# Patient Record
Sex: Male | Born: 1995 | Hispanic: Refuse to answer | Marital: Single | Smoking: Never smoker
Health system: Southern US, Community
[De-identification: ages and names within clinical notes are randomized; demographics above are authoritative.]

## PROBLEM LIST (undated history)

## (undated) DIAGNOSIS — R011 Cardiac murmur, unspecified: Secondary | ICD-10-CM

## (undated) HISTORY — DX: Cardiac murmur, unspecified: R01.1

---
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
## 2016-04-08 ENCOUNTER — Encounter: Payer: Self-pay | Admitting: Family Medicine

## 2016-04-08 ENCOUNTER — Ambulatory Visit (INDEPENDENT_AMBULATORY_CARE_PROVIDER_SITE_OTHER): Payer: 59 | Admitting: Family Medicine

## 2016-04-08 DIAGNOSIS — E7800 Pure hypercholesterolemia, unspecified: Secondary | ICD-10-CM

## 2016-04-08 NOTE — Progress Notes (Signed)
Patient not being seen today because he is not fasting. It is coming back tomorrow for fasting lipid panel.

## 2016-04-09 ENCOUNTER — Ambulatory Visit (INDEPENDENT_AMBULATORY_CARE_PROVIDER_SITE_OTHER): Payer: 59 | Admitting: Family Medicine

## 2016-04-09 ENCOUNTER — Ambulatory Visit: Payer: 59 | Admitting: Family Medicine

## 2016-04-09 VITALS — BP 129/71 | HR 79 | Temp 97.5°F | Resp 14

## 2016-04-09 DIAGNOSIS — Z8342 Family history of familial hypercholesterolemia: Secondary | ICD-10-CM

## 2016-04-09 MED ORDER — FLUTICASONE PROPIONATE 50 MCG/ACT NA SUSP: 2.0000 | Freq: Every day | NASAL | 6 refills | Status: DC

## 2016-04-09 MED ORDER — AMOXICILLIN 500 MG PO CAPS: 500.0000 mg | ORAL_CAPSULE | Freq: Two times a day (BID) | ORAL | 0 refills | Status: DC

## 2016-04-09 NOTE — Progress Notes (Signed)
Patient presents today for fasting lipid panel. Patient has a family history of high cholesterol with his father. He states that his father has hypertension, obesity, diabetes. Father with MI hx in his 30s. Patient has never been told that he has high cholesterol. He denies any other problems. Patient does not take any supplements.  ROS: Negative except mentioned above.  Vitals as per Epic.  GENERAL: NAD HEENT- mild pharyngeal erythema, no exudate, no erythema of TMs, no cervical LAD, mild maxillary tenderness L>R RESP: CTA B CARD: RRR NEURO: CN II-XII grossly intact   A/P: 1)Family history of high cholesterol - Will check fasting lipid panel today. Will inform patient of results next week when reviewed.  2)Sinusitis-will treat with Flonase, Claritin, Sudafed when necessary, Ibuprofen when necessary, amoxicillin, seek medical attention if symptoms persist or worsen.

## 2016-04-09 NOTE — Addendum Note (Signed)
Addended by: Dione HousekeeperPATEL, Cebastian Neis N on: 04/09/2016 12:53 PM   Modules accepted: Orders

## 2016-04-10 LAB — LIPID PANEL
CHOL/HDL RATIO: 2.2 ratio (ref 0.0–5.0)
Cholesterol, Total: 122 mg/dL (ref 100–169)
HDL: 56 mg/dL (ref >39)
LDL CALC: 53 mg/dL (ref 0–109)
TRIGLYCERIDES: 65 mg/dL (ref 0–89)
VLDL CHOLESTEROL CAL: 13 mg/dL (ref 5–40)

## 2016-04-22 ENCOUNTER — Other Ambulatory Visit: Payer: Self-pay | Admitting: Family Medicine

## 2016-04-22 MED ORDER — AZITHROMYCIN 250 MG PO TABS: ORAL_TABLET | ORAL | 0 refills | Status: DC

## 2016-08-20 ENCOUNTER — Ambulatory Visit (INDEPENDENT_AMBULATORY_CARE_PROVIDER_SITE_OTHER): Payer: 59 | Admitting: Family Medicine

## 2016-08-20 ENCOUNTER — Encounter: Payer: Self-pay | Admitting: Family Medicine

## 2016-08-20 VITALS — BP 120/76 | HR 78 | Temp 98.1°F | Resp 16

## 2016-08-20 DIAGNOSIS — J111 Influenza due to unidentified influenza virus with other respiratory manifestations: Secondary | ICD-10-CM

## 2016-08-20 NOTE — Progress Notes (Signed)
Patient presents with symptoms of nasal drainage, cough, myalgias, subjective fever, night sweats, chills since yesterday. Denies CP, SOB, NVD, severe headache. Took Ibuprofen this morning.   ROS: Negative except mentioned above. Vitals as per EPIC.  GENERAL: NAD HEENT: mild pharyngeal erythema, no exudate, no erythema of TMs, no cervical LAD RESP: CTA B CARD: RRR NEURO: CN II-XII grossly intact   A/P: Viral Illness - likely Influenza, risks/benefits of Tamiflu discussed, rest, hydration, Claritin prn, Delsym prn, Tylenol/Ibuprofen prn, seek medical attention if symptoms persist or worsen. No athletic activity un afebrile without fever lowering medication for at least 24hrs.

## 2016-09-30 ENCOUNTER — Ambulatory Visit (INDEPENDENT_AMBULATORY_CARE_PROVIDER_SITE_OTHER): Payer: 59 | Admitting: Family Medicine

## 2016-09-30 ENCOUNTER — Ambulatory Visit
Admission: RE | Admit: 2016-09-30 | Discharge: 2016-09-30 | Disposition: A | Discharge status: Home / Self Care | Payer: PRIVATE HEALTH INSURANCE | Source: Ambulatory Visit | Attending: Family Medicine | Admitting: Family Medicine

## 2016-09-30 ENCOUNTER — Encounter: Payer: Self-pay | Admitting: Family Medicine

## 2016-09-30 DIAGNOSIS — S8992XA Unspecified injury of left lower leg, initial encounter: Secondary | ICD-10-CM

## 2016-09-30 DIAGNOSIS — W1830XA Fall on same level, unspecified, initial encounter: Secondary | ICD-10-CM | POA: Insufficient documentation

## 2016-09-30 NOTE — Progress Notes (Signed)
Patient presents today for the knee/leg pain. Patient states that a few weeks ago he stumbled and fell onto the medial aspect of his knee. He states that he fell on concrete. He denies any other injury at the time. He states that there was some swelling and bruising initially. He was able to weight-bear with some pain. He states that now he is able to walk and run without any difficulty or pain. He states that there is a small pocket of swelling in the area where he injured himself and there is some tenderness to touch. 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  ROS: Negative except mentioned above. Vitals as per Epic GENERAL: NAD MSK: L Knee -no significant joint effusion, there is a small area of swelling in the area of the medial proximal tibia, there is tenderness to palpation of this area, full range of motion, negative McMurray, no laxity with varus or valgus stress, no pain with hopping, NV intact NEURO: CN II-XII grossly intact   A/P: Left knee injury - will do x-rays, functionally seems okay, will discuss plan of care after x-rays have been reviewed. Informed trainer. Seek medical attention if symptoms persist or worsen as discussed.

## 2017-07-12 ENCOUNTER — Ambulatory Visit (INDEPENDENT_AMBULATORY_CARE_PROVIDER_SITE_OTHER): Payer: Managed Care, Other (non HMO) | Admitting: Family Medicine

## 2017-07-12 ENCOUNTER — Encounter: Payer: Self-pay | Admitting: Family Medicine

## 2017-07-12 DIAGNOSIS — K644 Residual hemorrhoidal skin tags: Secondary | ICD-10-CM | POA: Diagnosis not present

## 2017-07-12 MED ORDER — HYDROCORTISONE ACETATE 25 MG RE SUPP: 25.0000 mg | Freq: Two times a day (BID) | RECTAL | 0 refills | Status: DC

## 2017-07-12 NOTE — Progress Notes (Signed)
Patient presents today with rectal pain. Patient states that his symptoms started after dead lifting in the weight room a few days ago. He states that is uncomfortable to sit. He denies any rectal bleeding. He denies any fever or chills. He did have a bowel movement yesterday but was uncomfortable. Patient has had an episode of hemorrhoids before. He does not have a tub to sit in. Patient plays soccer. He denies any abdominal pain. Patient has been using Preparation H OTC.  ROS: Negative except mentioned above. Vitals as per Epic. GENERAL: NAD RESP: CTA B CARD: RRR ABD: Positive bowel sounds, nontender, mild redness around the anal canal with mild swelling and pain on palpation in the 5 o'clock region, no bleeding appreciated NEURO: CN II-XII grossly intact   A/P: Rectal pain, hemorrhoid - does not appear to be thrombosed, will treat with suppository, Tylenol when necessary, avoid lifting for now, once symptoms improve can gradually start back to lifting, will need to modify amount of weight and repetitions moving forward, encourage taking stool softener and drinking plenty of water and eating a high-fiber diet to avoid constipation. Seek medical attention if symptoms persist or worsen as discussed

## 2017-07-13 ENCOUNTER — Other Ambulatory Visit: Payer: Self-pay | Admitting: Family Medicine

## 2017-07-13 ENCOUNTER — Ambulatory Visit (INDEPENDENT_AMBULATORY_CARE_PROVIDER_SITE_OTHER): Payer: Managed Care, Other (non HMO) | Admitting: Surgery

## 2017-07-13 ENCOUNTER — Encounter: Payer: Self-pay | Admitting: Surgery

## 2017-07-13 ENCOUNTER — Other Ambulatory Visit: Payer: Self-pay

## 2017-07-13 VITALS — BP 138/85 | HR 80 | Temp 98.5°F | Ht 74.0 in | Wt 195.4 lb

## 2017-07-13 DIAGNOSIS — K644 Residual hemorrhoidal skin tags: Secondary | ICD-10-CM | POA: Diagnosis not present

## 2017-07-13 DIAGNOSIS — K6289 Other specified diseases of anus and rectum: Secondary | ICD-10-CM

## 2017-07-13 MED ORDER — LIDOCAINE (ANORECTAL) 5 % EX CREA: 1.0000 "application " | TOPICAL_CREAM | Freq: Every day | CUTANEOUS | 0 refills | Status: AC

## 2017-07-13 MED ORDER — IBUPROFEN 600 MG PO TABS: 600.0000 mg | ORAL_TABLET | Freq: Three times a day (TID) | ORAL | 0 refills | Status: AC | PRN

## 2017-07-13 NOTE — Patient Instructions (Addendum)
We would like for you to take Miralax daily. Please be sure to increase fluid intake. Please continue to use the Suppositories as directed.  Please call our office if you have questions or concerns.     Hemorrhoids Hemorrhoids are swollen veins in and around the rectum or anus. Hemorrhoids can cause pain, itching, or bleeding. Most of the time, they do not cause serious problems. They usually get better with diet changes, lifestyle changes, and other home treatments. Follow these instructions at home: Eating and drinking  Eat foods that have fiber, such as whole grains, beans, nuts, fruits, and vegetables. Ask your doctor about taking products that have added fiber (fibersupplements).  Drink enough fluid to keep your pee (urine) clear or pale yellow. For Pain and Swelling  Take a warm-water bath (sitz bath) for 20 minutes to ease pain. Do this 3-4 times a day.  If directed, put ice on the painful area. It may be helpful to use ice between your warm baths. ? Put ice in a plastic bag. ? Place a towel between your skin and the bag. ? Leave the ice on for 20 minutes, 2-3 times a day. General instructions  Take over-the-counter and prescription medicines only as told by your doctor. ? Medicated creams and medicines that are inserted into the anus (suppositories) may be used or applied as told.  Exercise often.  Go to the bathroom when you have the urge to poop (to have a bowel movement). Do not wait.  Avoid pushing too hard (straining) when you poop.  Keep the butt area dry and clean. Use wet toilet paper or moist paper towels.  Do not sit on the toilet for a long time. Contact a doctor if:  You have any of these: ? Pain and swelling that do not get better with treatment or medicine. ? Bleeding that will not stop. ? Trouble pooping or you cannot poop. ? Pain or swelling outside the area of the hemorrhoids. This information is not intended to replace advice given to you by your  health care provider. Make sure you discuss any questions you have with your health care provider. Document Released: 03/30/2008 Document Revised: 11/27/2015 Document Reviewed: 03/05/2015 Elsevier Interactive Patient Education  2018 [employer]  High-Fiber Diet Fiber, also called dietary fiber, is a type of carbohydrate found in fruits, vegetables, whole grains, and beans. A high-fiber diet can have many health benefits. Your health care provider may recommend a high-fiber diet to help: Prevent constipation. Fiber can make your bowel movements more regular. Lower your cholesterol. Relieve hemorrhoids, uncomplicated diverticulosis, or irritable bowel syndrome. Prevent overeating as part of a weight-loss plan. Prevent heart disease, type 2 diabetes, and certain cancers.  What is my plan? The recommended daily intake of fiber includes: 38 grams for men under age 55. 30 grams for men over age 17. 25 grams for women under age 69. 21 grams for women over age 38.  You can get the recommended daily intake of dietary fiber by eating a variety of fruits, vegetables, grains, and beans. Your health care provider may also recommend a fiber supplement if it is not possible to get enough fiber through your diet. What do I need to know about a high-fiber diet? Fiber supplements have not been widely studied for their effectiveness, so it is better to get fiber through food sources. Always check the fiber content on thenutrition facts label of any prepackaged food. Look for foods that contain at least 5 grams of fiber  per serving. Ask your dietitian if you have questions about specific foods that are related to your condition, especially if those foods are not listed in the following section. Increase your daily fiber consumption gradually. Increasing your intake of dietary fiber too quickly may cause bloating, cramping, or gas. Drink plenty of water. Water helps you to digest fiber. What foods can I  eat? Grains Whole-grain breads. Multigrain cereal. Oats and oatmeal. Brown rice. Barley. Bulgur wheat. Millet. Bran muffins. Popcorn. Rye wafer crackers. Vegetables Sweet potatoes. Spinach. Kale. Artichokes. Cabbage. Broccoli. Green peas. Carrots. Squash. Fruits Berries. Pears. Apples. Oranges. Avocados. Prunes and raisins. Dried figs. Meats and Other Protein Sources Navy, kidney, pinto, and soy beans. Split peas. Lentils. Nuts and seeds. Dairy Fiber-fortified yogurt. Beverages Fiber-fortified soy milk. Fiber-fortified orange juice. Other Fiber bars. The items listed above may not be a complete list of recommended foods or beverages. Contact your dietitian for more options. What foods are not recommended? Grains White bread. Pasta made with refined flour. White rice. Vegetables Fried potatoes. Canned vegetables. Well-cooked vegetables. Fruits Fruit juice. Cooked, strained fruit. Meats and Other Protein Sources Fatty cuts of meat. Fried Environmental education officerpoultry or fried fish. Dairy Milk. Yogurt. Cream cheese. Sour cream. Beverages Soft drinks. Other Cakes and pastries. Butter and oils. The items listed above may not be a complete list of foods and beverages to avoid. Contact your dietitian for more information. What are some tips for including high-fiber foods in my diet? Eat a wide variety of high-fiber foods. Make sure that half of all grains consumed each day are whole grains. Replace breads and cereals made from refined flour or white flour with whole-grain breads and cereals. Replace white rice with brown rice, bulgur wheat, or millet. Start the day with a breakfast that is high in fiber, such as a cereal that contains at least 5 grams of fiber per serving. Use beans in place of meat in soups, salads, or pasta. Eat high-fiber snacks, such as berries, raw vegetables, nuts, or popcorn. This information is not intended to replace advice given to you by your health care provider. Make sure you  discuss any questions you have with your health care provider. Document Released: 06/21/2005 Document Revised: 11/27/2015 Document Reviewed: 12/04/2013 Elsevier Interactive Patient Education  2018 [employer]  Disposable Sitz Bath A disposable sitz bath is a plastic basin that fits over the toilet. A bag is hung above the toilet, and the bag is connected to a tube that opens into the basin. The bag is filled with warm water that flows into the basin through the tube. A sitz bath can be used to help relieve symptoms, clean, and promote healing in the genital and anal areas, as well as in the lower abdomen and buttocks. What are the risks? Sitz baths are generally very safe. It is possible for the skin between the genitals and the anus (perineum) to become infected, but this is rare. You can avoid this by cleaning your sitz bath supplies thoroughly. How to use a disposable sitz bath 1. Close the clamp on the tube. Make sure the clamp is closed tightly to prevent leakage. 2. Fill the sitz bath basin and the plastic bag with warm water. The water should be warm enough to be comfortable, but not hot. 3. Raise the toilet seat and place the filled basin on the toilet. Make sure the overflow opening is facing toward the back of the toilet. ? If you prefer, you may place the empty basin on the  toilet first, and then use the plastic bag to fill the basin with warm water. 4. Hang the filled plastic bag overhead on a hook or towel rack close to the toilet. The bag should be higher than the toilet so that the water will flow down through the tube. 5. Attach the tube to the opening on the basin. Make sure that the tube is attached to the basin tightly to prevent leakage. 6. Sit on the basin and release the clamp. This will allow warm water to flow into the basin and flush the area around your genitals and anus. 7. Remain sitting on the basin for about 15-20 minutes, or as long as told by your health care  provider. 8. Stand up and gently pat your skin dry. If directed, apply clean bandages (dressings) to the affected area as told by your health care provider. 9. Carefully remove the basin from the toilet seat and tip the basin into the toilet to empty any remaining water. Empty any remaining water from the plastic bag into the toilet. Then, flush the toilet. 10. Wash the basin with warm water and soap. Let the basin air dry in the sink. You should also let the plastic bag and the tubing air dry. 11. Store the basin, tubing, and plastic bag in a clean, dry area. 12. Wash your hands with soap and water. If soap and water are not available, use hand sanitizer. Contact a health care provider if:  You have symptoms that get worse instead of better.  You develop new skin irritation, redness, or swelling around your genitals or anus. This information is not intended to replace advice given to you by your health care provider. Make sure you discuss any questions you have with your health care provider. Document Released: 12/21/2011 Document Revised: 11/27/2015 Document Reviewed: 05/11/2015 Elsevier Interactive Patient Education  [employer].

## 2017-07-13 NOTE — Progress Notes (Signed)
07/13/2017  Reason for Visit:  External hemorrhoid  Referring Provider:  Jolene ProvostKirtida Patel, MD  History of Present Illness: Jared Niemannlexander Brock is a 22 y.o. male who presents with a 5 day history of perianal discomfort.  Patient reports that he was lifting weights on 1/4 when he felt a discomfort in the perianal area.  He has had issues with hemorrhoids in the past and thought it was the same.  However, yesterday his pain worsened and he had significant pain today with bowel movement.  Denies any fevers, chills, perianal drainage, bleeding, blood in the stool.  He saw his PCP yesterday and was prescribed Anusol suppository.  Given his worsening pain, he was referred to us for further evaluation.  Prior to visit, he was trying Preparation H with no improvement.   Past Medical History: Past Medical History:  Diagnosis Date  . Heart murmur    at the age of [age]yrs     Past Surgical History: Past Surgical History:  Procedure Laterality Date  . TONSILLECTOMY Bilateral 2003    Home Medications: Prior to Admission medications   Medication Sig Start Date End Date Taking? Authorizing Provider  ibuprofen (ADVIL,MOTRIN) 600 MG tablet Take 1 tablet (600 mg total) by mouth every 8 (eight) hours as needed. 07/13/17   Eluzer Howdeshell, Elita QuickJose, MD  Lidocaine, Anorectal, 5 % CREA Apply 1 application topically 6 (six) times daily. 07/13/17   Henrene DodgePiscoya, Alba Kriesel, MD    Allergies: Allergies  Allergen Reactions  . Sulfa Antibiotics Rash  . Sulfur Rash    At the age of [age]yrs    Review of Systems: Review of Systems  Constitutional: Negative for chills and fever.  Respiratory: Negative for shortness of breath.   Cardiovascular: Negative for chest pain.  Gastrointestinal: Negative for abdominal pain, blood in stool, constipation, diarrhea, nausea and vomiting.    Physical Exam BP 138/85   Pulse 80   Temp 98.5 F (36.9 C) (Oral)   Ht 6\' 2"  (1.88 m)   Wt 88.6 kg (195 lb 6.4 oz)   BMI 25.09 kg/m  CONSTITUTIONAL: No acute  distress RESPIRATORY:  Lungs are clear, and breath sounds are equal bilaterally. Normal respiratory effort without pathologic use of accessory muscles. CARDIOVASCULAR: Heart is regular without murmurs, gallops, or rubs. GI: The abdomen is soft, nondistended, nontender. RECTAL:  There is an enlarged left lateral external hemorrhoid, with is soft but tender to palpation.  There is no surrounding erythema or induration.  No spontaneous drainage and no purulent drainage. NEUROLOGIC:  Motor and sensation is grossly normal.  Cranial nerves are grossly intact. PSYCH:  Alert and oriented to person, place and time. Affect is normal.  Laboratory Analysis: No results found for this or any previous visit (from the past 24 hour(s)).  Imaging: No results found.  Assessment and Plan: This is a 22 y.o. male who presents with an enlarged painful external hemorrhoid.  Discussed with the patient that I do not believe he has an abscess as he has no surrounding erythema or induration and no drainage as well as no fevers, chills.  Likely this is an external hemorrhoid flare up.  There is no thrombosis and does not require I&D.    Instructed patient on conservative therapy for hemorrhoids.  Will give prescription for Lidocaine 5% ointment that he can use in addition to the Anusol prescribed.  He can also take Ibuprofen 600 mg TID for pain control.  He should take MiraLax once daily to keep bowels soft.  Also instructed on doing  Sitz baths and that he can buy a portable Sitz bath to use at home since he does not have a tub.  If there is no improvement, patient is to call back or come to emergency room for further evaluation.  Otherwise patient may follow up with his PCP.  Face-to-face time spent with the patient and care providers was 40 minutes, with more than 50% of the time spent counseling, educating, and coordinating care of the patient.     Howie Ill, MD [facility]

## 2017-08-03 IMAGING — CR DG KNEE COMPLETE 4+V*L*
1 series · 5 of 5 positions shown · non-contrast
Comparison: None.

CLINICAL DATA: Fell on sidewalk 3 weeks ago. Medial left knee pain.
Initial encounter.

EXAM:
LEFT KNEE - COMPLETE 4+ VIEW

[Series 1: dg knee 4 v w/ sunrise/patella left · 0.14mm/px · 5 of 5 slices shown]
[im 1/5]
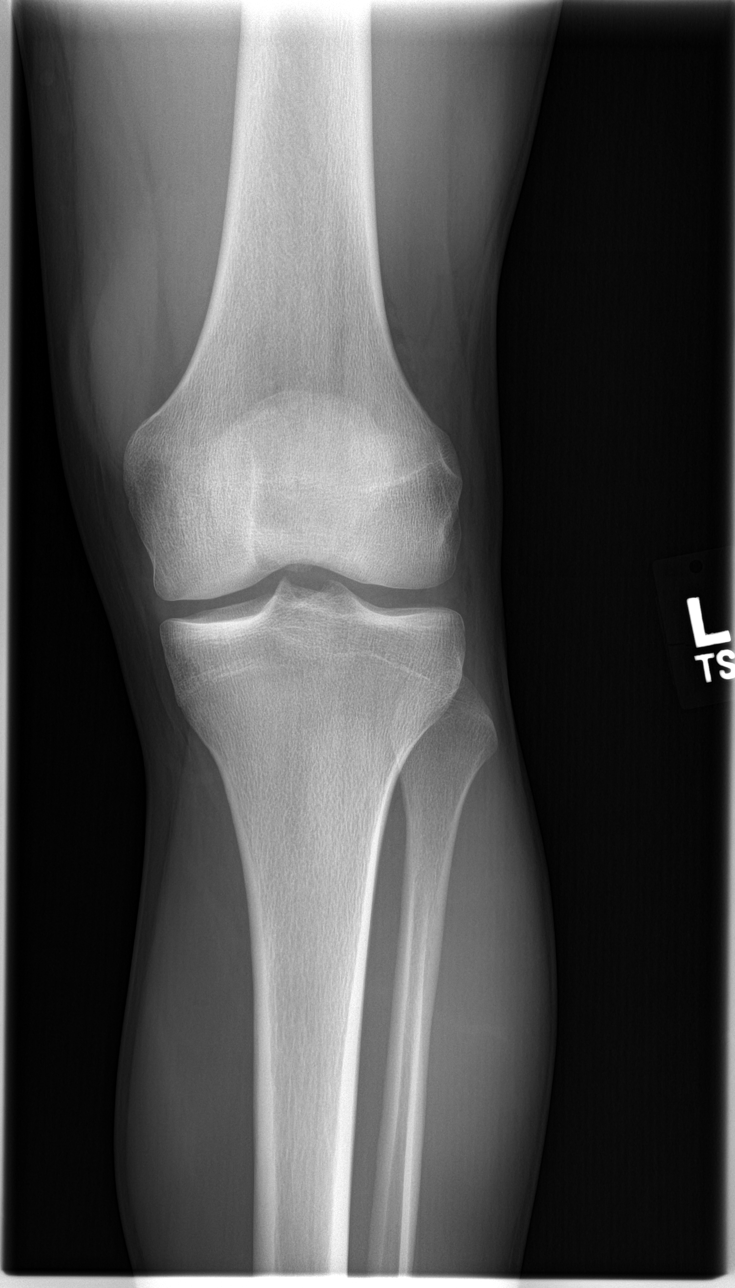
[im 2/5]
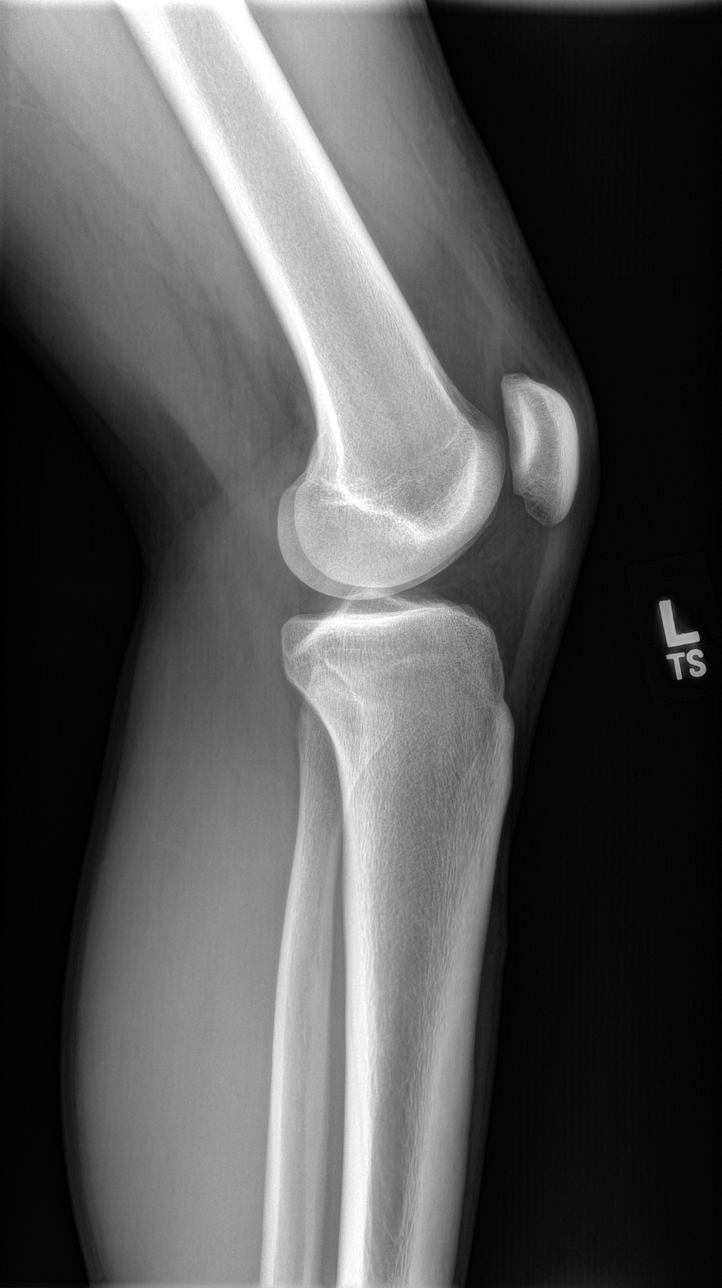
[im 3/5]
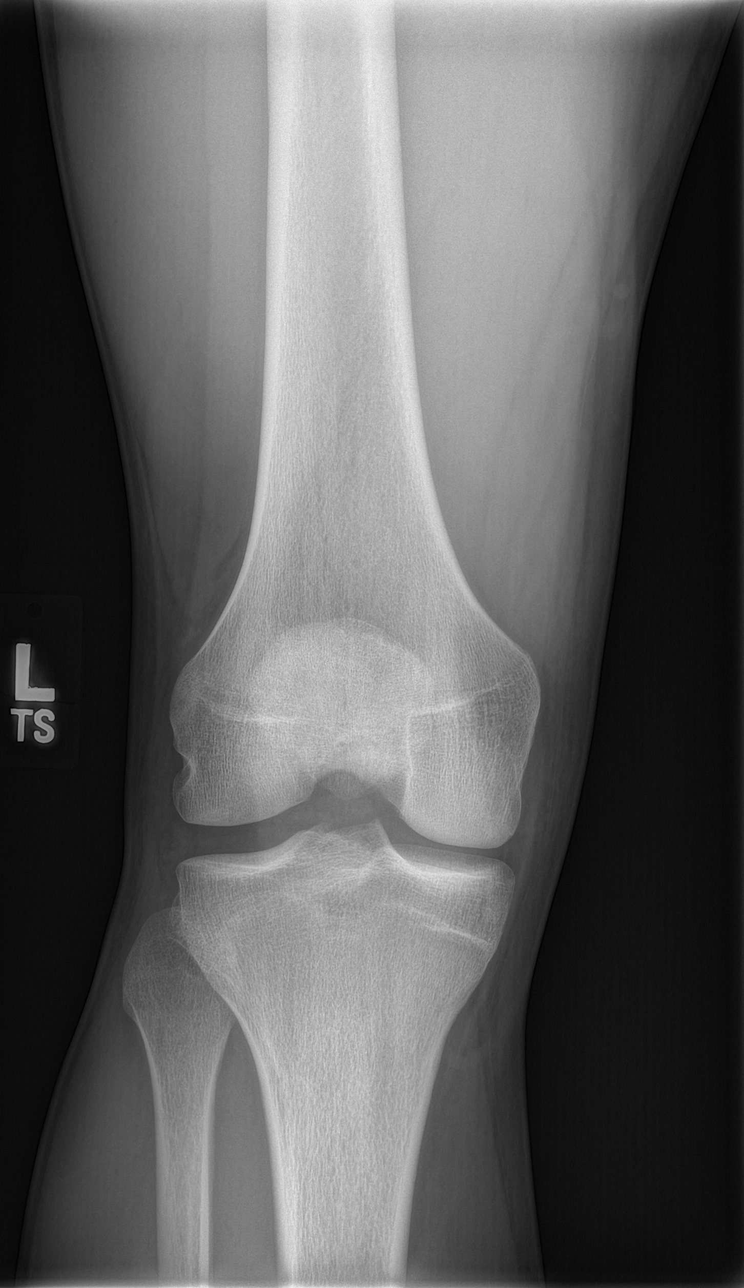
[im 4/5]
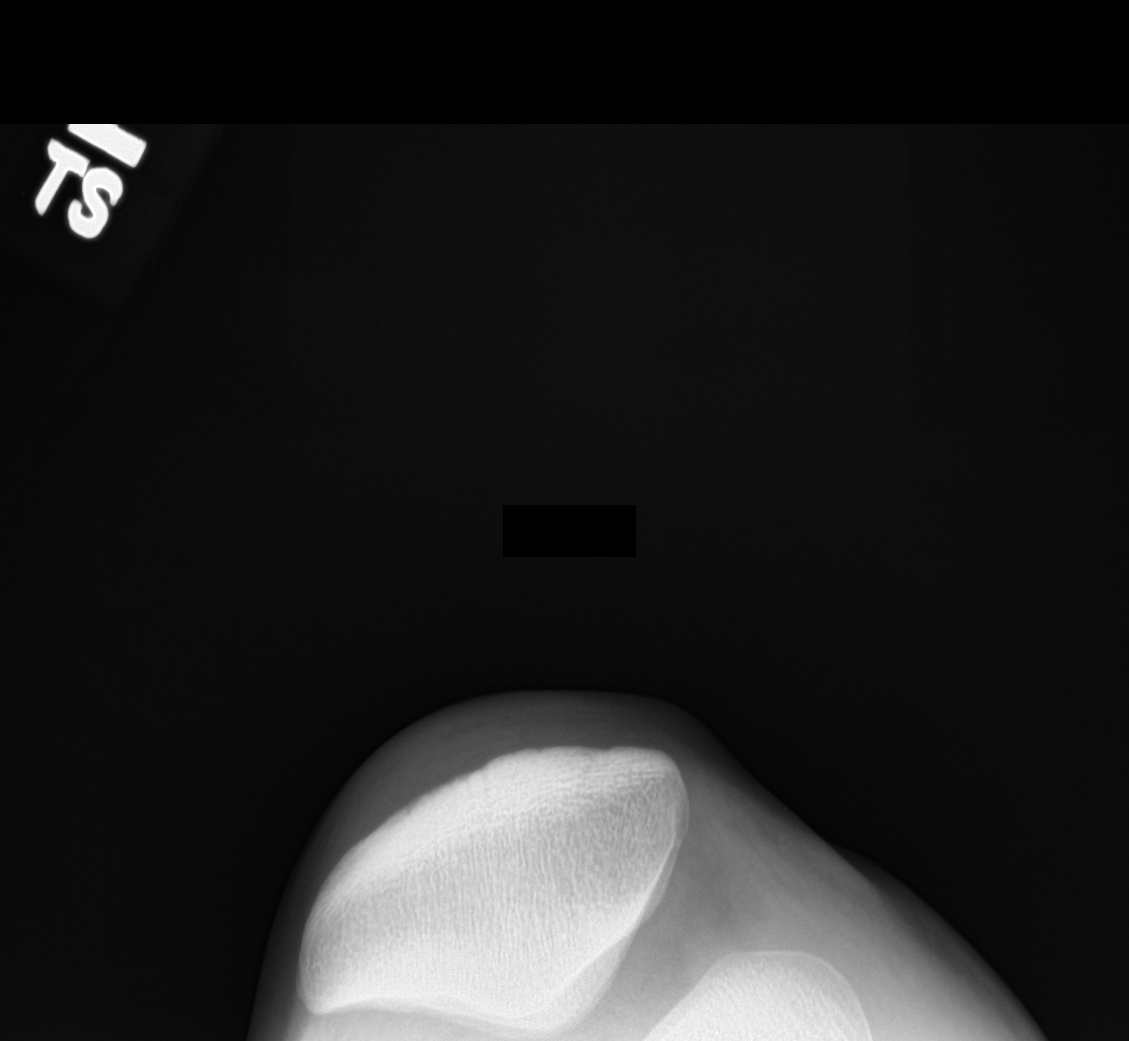
[im 5/5]
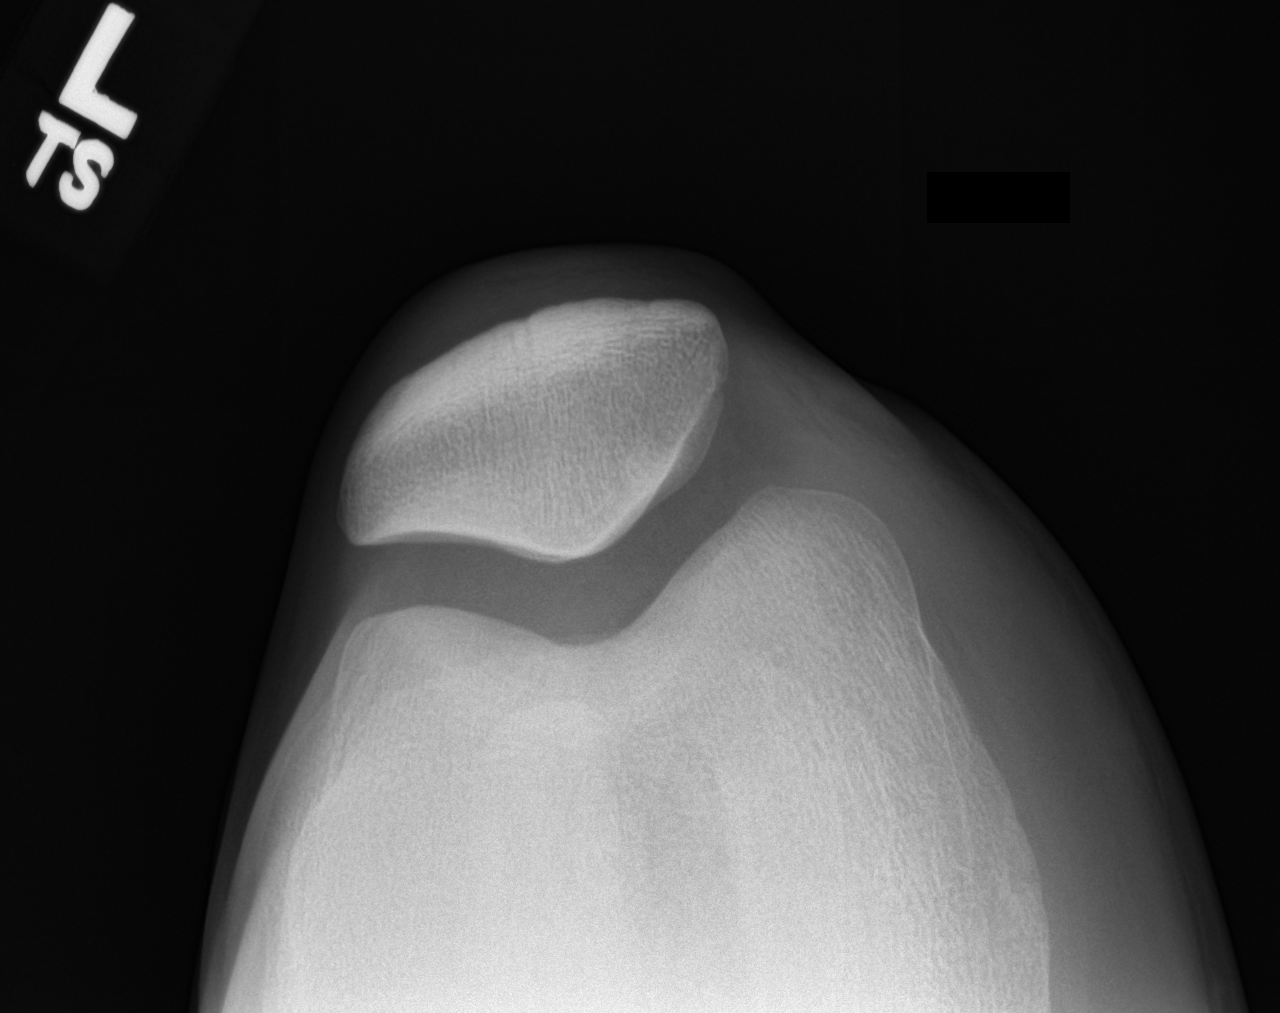

[5 of 5 positions shown; findings below may reference images not displayed]

FINDINGS: No evidence of fracture, dislocation, or joint effusion. No evidence
of arthropathy or other focal bone abnormality. Soft tissues are
unremarkable.
IMPRESSION: Negative.

## 2018-02-14 ENCOUNTER — Ambulatory Visit (INDEPENDENT_AMBULATORY_CARE_PROVIDER_SITE_OTHER): Payer: Managed Care, Other (non HMO) | Admitting: Family Medicine

## 2018-02-14 ENCOUNTER — Encounter: Payer: Self-pay | Admitting: Family Medicine

## 2018-02-14 VITALS — BP 128/79 | HR 79 | Temp 98.7°F | Resp 14

## 2018-02-14 DIAGNOSIS — R21 Rash and other nonspecific skin eruption: Secondary | ICD-10-CM

## 2018-02-14 NOTE — Progress Notes (Signed)
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 Patient states that he has had the rash off and on for the past few years. He was given a fungal medication (cream) the last time he had it. He denies any pain or itchiness of the rash. Currently he states the rash is on his right abdominal area. He denies any genital rash. He denies any new soaps or detergents. He states that the area does become slightly pink or lighter over time. Currently the spots haven't yet developed.  ROS: Negative except mentioned above. Vitals as per Epic.  GENERAL: NAD RESP: CTA B CARD: RRR SKIN: there are small slightly pink areas on the right lower abdominal side, no other significant rash/vesicles NEURO: CN II-XII grossly intact   A/P: Skin Rash -the area currently is not classic of tenia versicolor,  pictures were reviewed on the computer with the patient who states that over time the classic tenia versicolor type of rash does develop, discussed using Selsun Blue and leaving it on for 10-15 minutes on area then washing off once daily for 7-10 days, if any changes to the rash or worsening symptoms seek medical attention as discussed. Encourage patient not to stay in sweat for long periods of time. Patient addresses understanding.

## 2018-08-05 ENCOUNTER — Emergency Department
Admission: EM | Admit: 2018-08-05 | Discharge: 2018-08-05 | Disposition: A | Discharge status: Home / Self Care | Payer: Managed Care, Other (non HMO) | Attending: Emergency Medicine | Admitting: Emergency Medicine

## 2018-08-05 ENCOUNTER — Other Ambulatory Visit: Payer: Self-pay

## 2018-08-05 DIAGNOSIS — Z79899 Other long term (current) drug therapy: Secondary | ICD-10-CM | POA: Insufficient documentation

## 2018-08-05 DIAGNOSIS — K047 Periapical abscess without sinus: Secondary | ICD-10-CM | POA: Diagnosis not present

## 2018-08-05 DIAGNOSIS — K0889 Other specified disorders of teeth and supporting structures: Secondary | ICD-10-CM | POA: Diagnosis present

## 2018-08-05 MED ORDER — LIDOCAINE VISCOUS HCL 2 % MT SOLN
15.0000 mL | Freq: Once | OROMUCOSAL | Status: AC
  Administered 2018-08-05: 15 mL via OROMUCOSAL
  Filled 2018-08-05: qty 15

## 2018-08-05 MED ORDER — PREDNISONE 20 MG PO TABS
40.0000 mg | ORAL_TABLET | Freq: Once | ORAL | Status: AC
  Administered 2018-08-05: 40 mg via ORAL
  Filled 2018-08-05: qty 2

## 2018-08-05 MED ORDER — OXYCODONE-ACETAMINOPHEN 5-325 MG PO TABS: 1.0000 | ORAL_TABLET | ORAL | 0 refills | Status: AC | PRN

## 2018-08-05 NOTE — ED Triage Notes (Signed)
Pt with right lower jaw swelling since noon yesterday. Pt states took 400mg  of ibuprofen at 2000. Pt with noted right sided jaw swelling.

## 2018-08-05 NOTE — ED Provider Notes (Signed)
[facility] Emergency Department Provider Note   ____________________________________________   First MD Initiated Contact with Patient 08/05/18 0136     (approximate)  I have reviewed the triage vital signs and the nursing notes.   HISTORY  Chief Complaint Facial Swelling    HPI Goeffrey Berchtold is a 23 y.o. male who presents to the ED from home with a chief complaint of right dental pain and swelling.  Patient had wisdom teeth removed approximately 1 month ago by his dentist in [place].  Reports increased right lower jaw pain and swelling since noon yesterday.  Called his dentist who phoned in prescriptions for prednisone and clindamycin.  Patient took clindamycin first dose approximately 10 PM.  Has not yet started prednisone.  Presents to the ED due to the pain.  His dentist told him to touch base tomorrow to see how he is doing.  Denies associated fever, chills, chest pain, shortness of breath, abdominal pain, nausea or vomiting.  Denies recent trauma.    Past Medical History:  Diagnosis Date  . Heart murmur    at the age of 66yrs    There are no active problems to display for this patient.   Past Surgical History:  Procedure Laterality Date  . TONSILLECTOMY Bilateral 2003    Prior to Admission medications   Medication Sig Start Date End Date Taking? Authorizing Provider  ibuprofen (ADVIL,MOTRIN) 600 MG tablet Take 1 tablet (600 mg total) by mouth every 8 (eight) hours as needed. 07/13/17   Piscoya, Elita Quick, MD  Lidocaine, Anorectal, 5 % CREA Apply 1 application topically 6 (six) times daily. 07/13/17   Henrene Dodge, MD    Allergies Sulfa antibiotics and Sulfur  Family History  Problem Relation Age of Onset  . Hypertension Mother   . Cancer Mother   . Diabetes Mother   . Hypertension Father   . Diabetes Father   . Diabetes Maternal Grandmother     Social History Social History   Tobacco Use  . Smoking status: Never Smoker  .  Smokeless tobacco: Never Used  Substance Use Topics  . Alcohol use: No  . Drug use: Not on file    Review of Systems  Constitutional: No fever/chills Eyes: No visual changes. ENT: Positive for dentalgia and jaw swelling.  No sore throat. Cardiovascular: Denies chest pain. Respiratory: Denies shortness of breath. Gastrointestinal: No abdominal pain.  No nausea, no vomiting.  No diarrhea.  No constipation. Genitourinary: Negative for dysuria. Musculoskeletal: Negative for back pain. Skin: Negative for rash. Neurological: Negative for headaches, focal weakness or numbness.   ____________________________________________   PHYSICAL EXAM:  VITAL SIGNS: ED Triage Vitals  Enc Vitals Group     BP 08/05/18 0053 129/82     Pulse Rate 08/05/18 0053 (!) 52     Resp 08/05/18 0053 16     Temp 08/05/18 0053 98.9 F (37.2 C)     Temp Source 08/05/18 0053 Oral     SpO2 08/05/18 0053 99 %     Weight 08/05/18 0054 188 lb (85.3 kg)     Height 08/05/18 0054 6\' 2"  (1.88 m)     Head Circumference --      Peak Flow --      Pain Score 08/05/18 0054 8     Pain Loc --      Pain Edu? --      Excl. in GC? --     Constitutional: Alert and oriented. Well appearing and in no acute distress.  Eyes: Conjunctivae are normal. PERRL. EOMI. Head: Atraumatic. Nose: No congestion/rhinnorhea. Mouth/Throat: Mucous membranes are moist.  Mild right lower jaw swelling.  No warmth or erythema to the face.  Overall good dentition.  Small area of swelling/inflammation to right posterior molar postop site.  Neck: No stridor.   Cardiovascular: Normal rate, regular rhythm. Grossly normal heart sounds.  Good peripheral circulation. Respiratory: Normal respiratory effort.  No retractions. Lungs CTAB. Gastrointestinal: Soft and nontender. No distention. No abdominal bruits. No CVA tenderness. Musculoskeletal: No lower extremity tenderness nor edema.  No joint effusions. Neurologic:  Normal speech and language. No  gross focal neurologic deficits are appreciated. No gait instability. Skin:  Skin is warm, dry and intact. No rash noted. Psychiatric: Mood and affect are normal. Speech and behavior are normal.  ____________________________________________   LABS (all labs ordered are listed, but only abnormal results are displayed)  Labs Reviewed - No data to display ____________________________________________  EKG  None ____________________________________________  RADIOLOGY  ED MD interpretation: None  Official radiology report(s): No results found.  ____________________________________________   PROCEDURES  Procedure(s) performed: None  Procedures  Critical Care performed: No  ____________________________________________   INITIAL IMPRESSION / ASSESSMENT AND PLAN / ED COURSE  As part of my medical decision making, I reviewed the following data within the electronic MEDICAL RECORD NUMBER Nursing notes reviewed and incorporated and Notes from prior ED visits    23 year old male who presents with right lower jaw swelling and dental pain.  Has prescriptions for clindamycin and prednisone which he has not yet started.  Will administer prednisone, lidocaine rinse, Percocet.  Will discharge with prescription for Percocet and he will follow-up closely with his dentist.  Strict return precautions given.  Patient verbalizes understanding agrees with plan of care.      ____________________________________________   FINAL CLINICAL IMPRESSION(S) / ED DIAGNOSES  Final diagnoses:  Dental infection  Pain, dental     ED Discharge Orders    None       Note:  This document was prepared using Dragon voice recognition software and may include unintentional dictation errors.    Irean HongSung, Romonia Yanik J, MD 08/05/18 [phone]

## 2018-08-05 NOTE — Discharge Instructions (Addendum)
1.  Take the antibiotic and steroid your dentist prescribed. 2.  Continue ibuprofen as needed for pain; you may take Percocet as needed for more severe pain. 3.  Return to the ER for worsening symptoms, persistent vomiting, difficulty breathing or other concerns.
